# Patient Record
Sex: Female | Born: 1995 | Race: White | Hispanic: No | Marital: Single | Smoking: Never smoker
Health system: Southern US, Community
[De-identification: ages and names within clinical notes are randomized; demographics above are authoritative.]

## PROBLEM LIST (undated history)

## (undated) DIAGNOSIS — E109 Type 1 diabetes mellitus without complications: Secondary | ICD-10-CM

---
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
## 2021-10-01 ENCOUNTER — Emergency Department (HOSPITAL_COMMUNITY): Payer: Self-pay

## 2021-10-01 ENCOUNTER — Other Ambulatory Visit: Payer: Self-pay

## 2021-10-01 ENCOUNTER — Emergency Department (HOSPITAL_COMMUNITY)
Admission: EM | Admit: 2021-10-01 | Discharge: 2021-10-01 | Disposition: A | Discharge status: Home / Self Care | Payer: Self-pay | Attending: Emergency Medicine | Admitting: Emergency Medicine

## 2021-10-01 DIAGNOSIS — R509 Fever, unspecified: Secondary | ICD-10-CM | POA: Insufficient documentation

## 2021-10-01 DIAGNOSIS — J029 Acute pharyngitis, unspecified: Secondary | ICD-10-CM | POA: Insufficient documentation

## 2021-10-01 DIAGNOSIS — N9489 Other specified conditions associated with female genital organs and menstrual cycle: Secondary | ICD-10-CM | POA: Insufficient documentation

## 2021-10-01 DIAGNOSIS — R112 Nausea with vomiting, unspecified: Secondary | ICD-10-CM | POA: Insufficient documentation

## 2021-10-01 LAB — RESPIRATORY PANEL BY PCR

## 2021-10-01 LAB — CBC WITH DIFFERENTIAL/PLATELET
Abs Immature Granulocytes: 0.1 10*3/uL — ABNORMAL HIGH (ref 0.00–0.07)
Basophils Absolute: 0 10*3/uL (ref 0.0–0.1)
Basophils Relative: 0 %
Eosinophils Absolute: 0 10*3/uL (ref 0.0–0.5)
Eosinophils Relative: 0 %
HCT: 39.5 % (ref 36.0–46.0)
Hemoglobin: 13.5 g/dL (ref 12.0–15.0)
Immature Granulocytes: 1 %
Lymphocytes Relative: 4 %
Lymphs Abs: 0.6 10*3/uL — ABNORMAL LOW (ref 0.7–4.0)
MCH: 32.1 pg (ref 26.0–34.0)
MCHC: 34.2 g/dL (ref 30.0–36.0)
MCV: 93.8 fL (ref 80.0–100.0)
Monocytes Absolute: 1 10*3/uL (ref 0.1–1.0)
Monocytes Relative: 6 %
Neutro Abs: 14 10*3/uL — ABNORMAL HIGH (ref 1.7–7.7)
Neutrophils Relative %: 89 %
Platelets: 125 10*3/uL — ABNORMAL LOW (ref 150–400)
RBC: 4.21 MIL/uL (ref 3.87–5.11)
RDW: 11.9 % (ref 11.5–15.5)
WBC: 15.7 10*3/uL — ABNORMAL HIGH (ref 4.0–10.5)
nRBC: 0 % (ref 0.0–0.2)

## 2021-10-01 LAB — BASIC METABOLIC PANEL
Anion gap: 9 (ref 5–15)
BUN: 11 mg/dL (ref 6–20)
CO2: 21 mmol/L — ABNORMAL LOW (ref 22–32)
Calcium: 9.1 mg/dL (ref 8.9–10.3)
Chloride: 105 mmol/L (ref 98–111)
Creatinine, Ser: 0.74 mg/dL (ref 0.44–1.00)
GFR, Estimated: >60 mL/min (ref >60)
Glucose, Bld: 112 mg/dL — ABNORMAL HIGH (ref 70–99)
Potassium: 3.7 mmol/L (ref 3.5–5.1)
Sodium: 135 mmol/L (ref 135–145)

## 2021-10-01 LAB — I-STAT BETA HCG BLOOD, ED (MC, WL, AP ONLY): I-stat hCG, quantitative: <5 m[IU]/mL (ref <5)

## 2021-10-01 LAB — GROUP A STREP BY PCR: Group A Strep by PCR: NOT DETECTED

## 2021-10-01 MED ORDER — IOHEXOL 300 MG/ML  SOLN
75.0000 mL | Freq: Once | INTRAMUSCULAR | Status: AC | PRN
  Administered 2021-10-01: 75 mL via INTRAVENOUS

## 2021-10-01 MED ORDER — IOHEXOL 300 MG/ML  SOLN: 75.0000 mL | Freq: Once | INTRAMUSCULAR | Status: DC | PRN

## 2021-10-01 MED ORDER — ONDANSETRON HCL 4 MG/2ML IJ SOLN
4.0000 mg | Freq: Once | INTRAMUSCULAR | Status: AC
  Administered 2021-10-01: 4 mg via INTRAVENOUS
  Filled 2021-10-01: qty 2

## 2021-10-01 MED ORDER — SODIUM CHLORIDE 0.9 % IV BOLUS
1000.0000 mL | Freq: Once | INTRAVENOUS | Status: AC
  Administered 2021-10-01: 1000 mL via INTRAVENOUS

## 2021-10-01 MED ORDER — DEXAMETHASONE SODIUM PHOSPHATE 10 MG/ML IJ SOLN
10.0000 mg | Freq: Once | INTRAMUSCULAR | Status: AC
  Administered 2021-10-01: 10 mg via INTRAVENOUS
  Filled 2021-10-01: qty 1

## 2021-10-01 MED ORDER — PROMETHAZINE HCL 25 MG PO TABS: 25.0000 mg | ORAL_TABLET | Freq: Four times a day (QID) | ORAL | 0 refills | Status: AC | PRN

## 2021-10-01 MED ORDER — ONDANSETRON 4 MG PO TBDP: 4.0000 mg | ORAL_TABLET | Freq: Once | ORAL | Status: DC

## 2021-10-01 MED ORDER — KETOROLAC TROMETHAMINE 30 MG/ML IJ SOLN: 30.0000 mg | Freq: Once | INTRAMUSCULAR | 0 refills | Status: AC

## 2021-10-01 NOTE — Discharge Instructions (Signed)
Your COVID, flu, and strep swabs were all negative today.  Additionally, your CT scan of your neck was unremarkable for abscess.  Feel you likely have a different upper respiratory infection.  As these are almost always viral in nature, antibiotics are not recommended.  You were given steroids in the emergency department this evening, these should continue to work and decreased swelling in your throat.  Additionally, you may use Cepacol throat lozenges for pain relief as well as throat spray.  Both of these you can buy over-the-counter at your local pharmacy.  Also, please use Tylenol and ibuprofen as needed for fever management.  Additionally, I have given you a nausea medication.  Please take this as prescribed as needed for nausea and vomiting.  Return if you are unable to tolerate your own oral secretions or if you have vomiting which cannot be managed with nausea medication.  As always, you may return to the emergency department for any new or worsening symptoms.

## 2021-10-01 NOTE — ED Notes (Signed)
Pt transported to CT ?

## 2021-10-01 NOTE — ED Provider Notes (Signed)
Patient care assumed from [facility], PA-C at shift change.  Please see their note for further.  Briefly: Patient with sore throat that started today, worse on the left side with associated nausea and vomiting.  Left tonsil more swollen than right, she endorses significant difficulty swallowing although observed to be tolerating her secretions.  COVID, flu, strep negative.  Ddx: PTA, RPA, URI  Plan: CT neck and IV fluids, reevaluation based on results of scan, potential fluid challenge and dispo with antiemetics.  Patient's CT scan unremarkable for acute findings.  Discussed results with patient, gave Sprite which she was able to drink without emesis or difficulty.  Upon reevaluation and fluid challenge, patient states that she is feeling much better and would like to go home.  Suspect that patient has an upper respiratory infection, plan to discharge with antiemetics and close follow-up with supportive care management.  Discussed this with patient who is amenable with plan of discharge, educated on red flag symptoms that would prompt immediate return.  She is afebrile, nontoxic-appearing, in no acute distress and it has been sometime since her last episode of emesis.  Discharged in stable condition.   Vear Clock 10/01/21 Eyvonne Left, MD 10/02/21 [phone]

## 2021-10-01 NOTE — ED Triage Notes (Signed)
BIB POV, after pt reports increase n/v as of this AM. Per pt, pt unable to hold any PO intake down, has been having chills and throat is sore.

## 2021-10-01 NOTE — ED Notes (Signed)
Pt returned from CT °

## 2021-10-01 NOTE — ED Provider Notes (Signed)
[facility] EMERGENCY DEPARTMENT Provider Note   CSN: [id number] Arrival date & time: 10/01/21  1055    History Flu symptoms   Karen Oneill is a 25 y.o. female with no significant past medical history who presents for evaluation of sore throat, fever and emesis.  Began 2 days ago.  History of similar requiring hospitalization.  Denies chance of pregnancy.  States  sore throat worse on the left.  Multiple episodes of NBNB emesis, unable to tolerate liquids.  Has had some chills without documented fever.  No cough, congestion, rhinorrhea, shortness of breath, abdominal pain, diarrhea, dysuria.  Feels like her bilateral upper extremities are contracting due to dehydration.  Denies additional aggravating or alleviating factors.  History obtained from patient and past medical records.  No interpreter is used.  HPI     No past medical history on file.  There are no problems to display for this patient.   No past surgical history on file.   OB History   No obstetric history on file.     No family history on file.  Social History   Tobacco Use   Smoking status: Never  Substance Use Topics   Alcohol use: No   Drug use: No    Home Medications Prior to Admission medications   Medication Sig Start Date End Date Taking? Authorizing Provider  ketorolac (TORADOL) 30 MG/ML injection Inject 1 mL (30 mg total) into the vein once for 1 dose. 10/01/21 10/01/21 Yes Tanis Burnley A, PA-C    Allergies    Sulfa antibiotics  Review of Systems   Review of Systems  Constitutional:  Positive for chills and fever (subjective).  HENT:  Positive for sore throat and trouble swallowing. Negative for congestion, postnasal drip, rhinorrhea, sinus pressure, sneezing and voice change.   Eyes: Negative.   Respiratory: Negative.    Cardiovascular: Negative.   Gastrointestinal:  Positive for nausea and vomiting. Negative for abdominal distention, abdominal pain, anal  bleeding, blood in stool, diarrhea and rectal pain.  Genitourinary: Negative.   Musculoskeletal: Negative.   Skin: Negative.   Neurological: Negative.   All other systems reviewed and are negative.  Physical Exam Updated Vital Signs BP 124/82 (BP Location: Right Arm)   Pulse 97   Temp 98.6 F (37 C)   Resp 20   SpO2 100%   Physical Exam Vitals and nursing note reviewed.  Constitutional:      General: She is not in acute distress.    Appearance: She is well-developed. She is not ill-appearing, toxic-appearing or diaphoretic.  HENT:     Head: Normocephalic and atraumatic.     Jaw: There is normal jaw occlusion.     Comments: No drooling or dysphagia or trismus.    Nose: Nose normal. No congestion or rhinorrhea.     Mouth/Throat:     Lips: Pink.     Mouth: Mucous membranes are moist.     Tongue: No lesions. Tongue does not deviate from midline.     Pharynx: Oropharynx is clear. Posterior oropharyngeal erythema present. No oropharyngeal exudate or uvula swelling.     Tonsils: No tonsillar exudate. 1+ on the right. 2+ on the left.     Comments: Posterior oropharynx erythematous, tonsillar edema left greater than right, 2+ left, 1+ right.  Uvula midline without edema.  Sublingual area soft.  No pooling of secretions Eyes:     Pupils: Pupils are equal, round, and reactive to light.  Neck:     Trachea:  Trachea and phonation normal.     Comments: Full range of motion neck.  Normal phonation Cardiovascular:     Rate and Rhythm: Normal rate.  Pulmonary:     Effort: Pulmonary effort is normal. No respiratory distress.     Breath sounds: Normal breath sounds and air entry.     Comments: Clear Bilaterally, speaks in full sentences Abdominal:     General: Bowel sounds are normal. There is no distension.     Palpations: Abdomen is soft.     Tenderness: There is no abdominal tenderness.     Comments: Soft, nontender  Musculoskeletal:        General: Normal range of motion.      Cervical back: Full passive range of motion without pain and normal range of motion.  Skin:    General: Skin is warm and dry.  Neurological:     General: No focal deficit present.     Mental Status: She is alert.  Psychiatric:        Mood and Affect: Mood normal.    ED Results / Procedures / Treatments   Labs (all labs ordered are listed, but only abnormal results are displayed) Labs Reviewed  CBC WITH DIFFERENTIAL/PLATELET - Abnormal; Notable for the following components:      Result Value   WBC 15.7 (*)    Platelets 125 (*)    Neutro Abs 14.0 (*)    Lymphs Abs 0.6 (*)    Abs Immature Granulocytes 0.10 (*)    All other components within normal limits  BASIC METABOLIC PANEL - Abnormal; Notable for the following components:   CO2 21 (*)    Glucose, Bld 112 (*)    All other components within normal limits  RESPIRATORY PANEL BY PCR  GROUP A STREP BY PCR  I-STAT BETA HCG BLOOD, ED ([facility], [facility], [facility] ONLY)    EKG None  Radiology No results found.  Procedures Procedures   Medications Ordered in ED Medications  sodium chloride 0.9 % bolus 1,000 mL (1,000 mLs Intravenous New Bag/Given 10/01/21 1421)  ondansetron (ZOFRAN) injection 4 mg (4 mg Intravenous Given 10/01/21 1419)  dexamethasone (DECADRON) injection 10 mg (10 mg Intravenous Given 10/01/21 1420)    ED Course  I have reviewed the triage vital signs and the nursing notes.  Pertinent labs & imaging results that were available during my care of the patient were reviewed by me and considered in my medical decision making (see chart for details).  Here for evaluation of sore throat and fever as well as difficulty tolerating p.o. intake at home.  On arrival she is afebrile, nonseptic, not ill-appearing.  Multiple episodes of NBNB emesis at home.  She has asymmetrical tonsillar edema however no pooling of secretions, no phonation changes.  Uvula without any edema.  No obvious angioedema.  Heart and lungs clear.  Abdomen soft,  nontender.  We will plan on labs, imaging  Work-up obtained from triage which I personally reviewed and interpreted:  Strep test negative RPV negative CBC leukocytosis at 15.7 Metabolic panel glucose 112 Preg test negative  Care transferred to oncoming provider who will follow up on imaging, p.o. challenge and determine disposition  Clinical Course as of 10/01/21 1524  Wed Oct 01, 2021  1511 Sore throat worse on left throwing up, Zofran, CT neck for PTA, tolerating secretions [SS]    Clinical Course User Index [SS] Smoot, Shawn Route, PA-C   MDM Rules/Calculators/A&P  Final Clinical Impression(s) / ED Diagnoses Final diagnoses:  Nausea and vomiting, unspecified vomiting type  Sore throat    Rx / DC Orders ED Discharge Orders          Ordered    ketorolac (TORADOL) 30 MG/ML injection   Once        10/01/21 1457             Kynzli Rease A, PA-C 10/01/21 1524    Virgina Norfolk, DO 10/03/21 1343

## 2021-10-05 ENCOUNTER — Emergency Department (HOSPITAL_COMMUNITY)
Admission: EM | Admit: 2021-10-05 | Discharge: 2021-10-05 | Disposition: A | Discharge status: Home / Self Care | Payer: Self-pay | Attending: Emergency Medicine | Admitting: Emergency Medicine

## 2021-10-05 ENCOUNTER — Other Ambulatory Visit: Payer: Self-pay

## 2021-10-05 ENCOUNTER — Encounter (HOSPITAL_COMMUNITY): Payer: Self-pay

## 2021-10-05 DIAGNOSIS — Z20822 Contact with and (suspected) exposure to covid-19: Secondary | ICD-10-CM | POA: Insufficient documentation

## 2021-10-05 DIAGNOSIS — J039 Acute tonsillitis, unspecified: Secondary | ICD-10-CM

## 2021-10-05 DIAGNOSIS — J029 Acute pharyngitis, unspecified: Secondary | ICD-10-CM

## 2021-10-05 DIAGNOSIS — E109 Type 1 diabetes mellitus without complications: Secondary | ICD-10-CM | POA: Insufficient documentation

## 2021-10-05 HISTORY — DX: Type 1 diabetes mellitus without complications: E10.9

## 2021-10-05 LAB — CBC WITH DIFFERENTIAL/PLATELET
Abs Immature Granulocytes: 0.05 10*3/uL (ref 0.00–0.07)
Basophils Absolute: 0 10*3/uL (ref 0.0–0.1)
Basophils Relative: 0 %
Eosinophils Absolute: 0.1 10*3/uL (ref 0.0–0.5)
Eosinophils Relative: 1 %
HCT: 37.3 % (ref 36.0–46.0)
Hemoglobin: 12.7 g/dL (ref 12.0–15.0)
Immature Granulocytes: 0 %
Lymphocytes Relative: 8 %
Lymphs Abs: 1 10*3/uL (ref 0.7–4.0)
MCH: 32.1 pg (ref 26.0–34.0)
MCHC: 34 g/dL (ref 30.0–36.0)
MCV: 94.2 fL (ref 80.0–100.0)
Monocytes Absolute: 1.3 10*3/uL — ABNORMAL HIGH (ref 0.1–1.0)
Monocytes Relative: 10 %
Neutro Abs: 11 10*3/uL — ABNORMAL HIGH (ref 1.7–7.7)
Neutrophils Relative %: 81 %
Platelets: 141 10*3/uL — ABNORMAL LOW (ref 150–400)
RBC: 3.96 MIL/uL (ref 3.87–5.11)
RDW: 12.2 % (ref 11.5–15.5)
WBC: 13.5 10*3/uL — ABNORMAL HIGH (ref 4.0–10.5)
nRBC: 0 % (ref 0.0–0.2)

## 2021-10-05 LAB — COMPREHENSIVE METABOLIC PANEL
ALT: 13 U/L (ref 0–44)
AST: 13 U/L — ABNORMAL LOW (ref 15–41)
Albumin: 4 g/dL (ref 3.5–5.0)
Alkaline Phosphatase: 78 U/L (ref 38–126)
Anion gap: 12 (ref 5–15)
BUN: 12 mg/dL (ref 6–20)
CO2: 26 mmol/L (ref 22–32)
Calcium: 9.2 mg/dL (ref 8.9–10.3)
Chloride: 99 mmol/L (ref 98–111)
Creatinine, Ser: 0.48 mg/dL (ref 0.44–1.00)
GFR, Estimated: >60 mL/min (ref >60)
Glucose, Bld: 100 mg/dL — ABNORMAL HIGH (ref 70–99)
Potassium: 3.4 mmol/L — ABNORMAL LOW (ref 3.5–5.1)
Sodium: 137 mmol/L (ref 135–145)
Total Bilirubin: 1 mg/dL (ref 0.3–1.2)
Total Protein: 8 g/dL (ref 6.5–8.1)

## 2021-10-05 LAB — RESP PANEL BY RT-PCR (FLU A&B, COVID) ARPGX2
Influenza A by PCR: NEGATIVE
Influenza B by PCR: NEGATIVE
SARS Coronavirus 2 by RT PCR: NEGATIVE

## 2021-10-05 LAB — GROUP A STREP BY PCR: Group A Strep by PCR: NOT DETECTED

## 2021-10-05 LAB — MONONUCLEOSIS SCREEN: Mono Screen: NEGATIVE

## 2021-10-05 LAB — LIPASE, BLOOD: Lipase: 24 U/L (ref 11–51)

## 2021-10-05 MED ORDER — ONDANSETRON HCL 4 MG/2ML IJ SOLN
INTRAMUSCULAR | Status: AC
  Administered 2021-10-05: 14:00:00 4 mg via INTRAVENOUS
  Filled 2021-10-05: qty 2

## 2021-10-05 MED ORDER — CLINDAMYCIN HCL 300 MG PO CAPS: 300.0000 mg | ORAL_CAPSULE | Freq: Three times a day (TID) | ORAL | 0 refills | Status: DC

## 2021-10-05 MED ORDER — CLINDAMYCIN HCL 300 MG PO CAPS: 300.0000 mg | ORAL_CAPSULE | Freq: Three times a day (TID) | ORAL | 0 refills | Status: AC

## 2021-10-05 MED ORDER — PREDNISONE 50 MG PO TABS: 50.0000 mg | ORAL_TABLET | Freq: Every day | ORAL | 0 refills | Status: AC

## 2021-10-05 MED ORDER — PREDNISONE 50 MG PO TABS: 50.0000 mg | ORAL_TABLET | Freq: Every day | ORAL | 0 refills | Status: DC

## 2021-10-05 MED ORDER — ONDANSETRON HCL 4 MG PO TABS: 4.0000 mg | ORAL_TABLET | Freq: Four times a day (QID) | ORAL | 0 refills | Status: AC

## 2021-10-05 MED ORDER — MORPHINE SULFATE (PF) 4 MG/ML IV SOLN
4.0000 mg | Freq: Once | INTRAVENOUS | Status: AC
Start: 2021-10-05 — End: 2021-10-05
  Administered 2021-10-05: 10:00:00 4 mg via INTRAVENOUS
  Filled 2021-10-05: qty 1

## 2021-10-05 MED ORDER — LACTATED RINGERS IV BOLUS
1000.0000 mL | Freq: Once | INTRAVENOUS | Status: AC
  Administered 2021-10-05: 10:00:00 1000 mL via INTRAVENOUS

## 2021-10-05 MED ORDER — METHYLPREDNISOLONE SODIUM SUCC 125 MG IJ SOLR
125.0000 mg | Freq: Once | INTRAMUSCULAR | Status: AC
  Administered 2021-10-05: 10:00:00 125 mg via INTRAVENOUS
  Filled 2021-10-05: qty 2

## 2021-10-05 MED ORDER — ONDANSETRON HCL 4 MG/2ML IJ SOLN: 4.0000 mg | Freq: Once | INTRAMUSCULAR | Status: AC

## 2021-10-05 MED ORDER — ONDANSETRON HCL 4 MG/2ML IJ SOLN
4.0000 mg | Freq: Once | INTRAMUSCULAR | Status: AC
  Administered 2021-10-05: 12:00:00 4 mg via INTRAVENOUS
  Filled 2021-10-05: qty 2

## 2021-10-05 NOTE — ED Triage Notes (Signed)
Patient has had a sore throat that has getting worse over the past 5 days, now she said she cannot talk. Tonsils inflamed. She was seen at [facility] and her strep swab, covid and flu were all negative. Patient is able to swallow.

## 2021-10-05 NOTE — Discharge Instructions (Addendum)
You were diagnosed with a likely bacterial infection of your tonsils. I have given you some steroids while you are here to help with your swelling, but I also have sent in a prescription of steroids for you to take for the next 5 days - please take your first dose tomorrow since we gave you the injection already for today. Additionally, I have written a prescription for an antibiotic that you will take 3 times a day for 10 days. Please return if your swelling worsens and you have trouble breathing. I hope you feel better soon!! Alcario Drought

## 2021-10-05 NOTE — ED Provider Notes (Signed)
[facility]-EMERGENCY DEPT Provider Note   CSN: [id number] Arrival date & time: 10/05/21  0456     History Chief Complaint  Patient presents with   Sore Throat    Karen Oneill is a 25 y.o. female who presents for evaluation of sore throat with tonsillar swelling that started about 5 days ago and has been worsening since onset.  Patient was seen at [facility] 4 days ago with negative COVID, flu and strep tests.  CT scan of the neck at that time unremarkable for acute findings.  She was given some fluids and some nausea medication with some symptomatic improvement she was discharged home with nausea medication, however she states that her symptoms have been worsening since she left.  On exam she is unable to physically speak, instead she is using her phone to type out her messages due to the swelling of her tonsils.  She has been having intermittent fevers of around 101F with chills and cold sweats.  She has been taking 2 mg ibuprofen every 4 hours without symptomatic relief.  She also endorses headaches, difficulty breathing and some mild right upper quadrant pain that she describes as "jabbing".  She continues to feel nauseous and has vomited 4 times over the last 24 hours.  She does report some bright red blood observed and more recent episodes of emesis.  She denies symptoms of congestion, runny nose, chest pain, diarrhea.  No urinary symptoms.  Sore Throat Associated symptoms include abdominal pain and headaches. Pertinent negatives include no shortness of breath.      Past Medical History:  Diagnosis Date   Diabetes mellitus type 1 (HCC)     There are no problems to display for this patient.   History reviewed. No pertinent surgical history.   OB History   No obstetric history on file.     History reviewed. No pertinent family history.  Social History   Tobacco Use   Smoking status: Never  Substance Use Topics   Alcohol use: No   Drug use: No     Home Medications Prior to Admission medications   Medication Sig Start Date End Date Taking? Authorizing Provider  promethazine (PHENERGAN) 25 MG tablet Take 1 tablet (25 mg total) by mouth every 6 (six) hours as needed for nausea or vomiting. 10/01/21   Smoot, Shawn Route, PA-C    Allergies    Sulfa antibiotics  Review of Systems   Review of Systems  Constitutional:  Positive for fever.  HENT:  Positive for sore throat and trouble swallowing. Negative for rhinorrhea.   Eyes: Negative.   Respiratory:  Negative for shortness of breath.   Cardiovascular: Negative.   Gastrointestinal:  Positive for abdominal pain and nausea. Negative for diarrhea and vomiting.  Endocrine: Negative.   Genitourinary: Negative.   Musculoskeletal: Negative.   Skin:  Negative for rash.  Neurological:  Positive for headaches.  All other systems reviewed and are negative.  Physical Exam Updated Vital Signs BP 132/80 (BP Location: Right Arm)   Pulse 98   Temp 99.3 F (37.4 C) (Oral)   Resp 15   Ht 5\' 6"  (1.676 m)   Wt 65.8 kg   SpO2 95%   BMI 23.40 kg/m   Physical Exam Vitals and nursing note reviewed.  Constitutional:      General: She is not in acute distress.    Appearance: She is not ill-appearing.  HENT:     Head: Atraumatic.     Mouth/Throat:  Tonsils: 2+ on the right. 2+ on the left.  Eyes:     Conjunctiva/sclera: Conjunctivae normal.  Cardiovascular:     Rate and Rhythm: Normal rate and regular rhythm.     Pulses: Normal pulses.     Heart sounds: No murmur heard. Pulmonary:     Effort: Pulmonary effort is normal. No respiratory distress.     Breath sounds: Normal breath sounds.  Abdominal:     General: Abdomen is flat. There is no distension.     Palpations: Abdomen is soft.     Tenderness: There is no abdominal tenderness.  Musculoskeletal:        General: Normal range of motion.     Cervical back: Normal range of motion.  Skin:    General: Skin is warm and dry.      Capillary Refill: Capillary refill takes less than 2 seconds.  Neurological:     General: No focal deficit present.     Mental Status: She is alert.  Psychiatric:        Mood and Affect: Mood normal.    ED Results / Procedures / Treatments   Labs (all labs ordered are listed, but only abnormal results are displayed) Labs Reviewed  CBC WITH DIFFERENTIAL/PLATELET - Abnormal; Notable for the following components:      Result Value   WBC 13.5 (*)    Platelets 141 (*)    Neutro Abs 11.0 (*)    Monocytes Absolute 1.3 (*)    All other components within normal limits  RESP PANEL BY RT-PCR (FLU A&B, COVID) ARPGX2  GROUP A STREP BY PCR  MONONUCLEOSIS SCREEN  COMPREHENSIVE METABOLIC PANEL  LIPASE, BLOOD    EKG None  Radiology No results found.  Procedures Procedures   Medications Ordered in ED Medications  methylPREDNISolone sodium succinate (SOLU-MEDROL) 125 mg/2 mL injection 125 mg (125 mg Intravenous Given 10/05/21 1028)  lactated ringers bolus 1,000 mL (1,000 mLs Intravenous New Bag/Given 10/05/21 1027)  morphine 4 MG/ML injection 4 mg (4 mg Intravenous Given 10/05/21 1029)    ED Course  I have reviewed the triage vital signs and the nursing notes.  Pertinent labs & imaging results that were available during my care of the patient were reviewed by me and considered in my medical decision making (see chart for details).    MDM Rules/Calculators/A&P                         This is a 25 year old female who presents for evaluation of bilateral tonsillar swelling, sore throat.  She was seen at [facility] 4 days ago for similar symptoms.  At that time she had negative strep, negative COVID flu.  CT scan of her throat showed no acute infection and she was discharged home viral URI.  Since then patient's sore throat has continued to swell, and she is unable to speak today due to swelling is instead of typing on her phone to communicate.  On exam, there is significant bilateral  tonsillar swelling, erythema and exudate.  Palpable anterior cervical lymph nodes.  There is no trismus or drooling.  She is oxygenating in your 100% on room air.  Vitals are otherwise normal, she is afebrile.    COVID, flu and strep test were negative again today.  I also had a monotest performed as well which was negative.  CMP and CBC were relatively unremarkable except for elevated white blood cell count of about 14.  In the ED,  I gave patient morphine for pain, Zofran for nausea and 125 mg Solu-Medrol with a 1 L lactated ringer bolus.  Patient had some improvement with her symptoms, stating that she feels better than when she first walked in.  All her test were negative, given the worsening swelling, pain and neutrophilia suspect that patient has a an unspecified bacterial infection.  I will discharge her home with a course of clindamycin as well as a short course of prednisone to assist with her swelling and pain.  I discussed this all with patient, who understands, agrees and is amenable to plan.      Low suspicion for peritonsillar abscess given the bilateral nature and swelling.  Uvula midline.  Low suspicion for sepsis, patient is nontoxic-appearing, afebrile nontachycardic.  Low concern for mono given negative Monospot test.  All labs were independently reviewed and interpreted by myself, Raynald Blend, PA-C  Final Clinical Impression(s) / ED Diagnoses Final diagnoses:  Acute pharyngitis, unspecified etiology  Tonsillitis    Rx / DC Orders ED Discharge Orders          Ordered    predniSONE (DELTASONE) 50 MG tablet  Daily,   Status:  Discontinued        10/05/21 1319    predniSONE (DELTASONE) 50 MG tablet  Daily        10/05/21 1319    clindamycin (CLEOCIN) 300 MG capsule  3 times daily,   Status:  Discontinued        10/05/21 1313    clindamycin (CLEOCIN) 300 MG capsule  3 times daily        10/05/21 1319             Janell Quiet, [place] 10/05/21 1329    Linwood Dibbles, MD 10/06/21 [phone]

## 2022-02-09 ENCOUNTER — Emergency Department (HOSPITAL_COMMUNITY)
Admission: EM | Admit: 2022-02-09 | Discharge: 2022-02-09 | Disposition: A | Discharge status: Home / Self Care | Payer: Self-pay | Attending: Emergency Medicine | Admitting: Emergency Medicine

## 2022-02-09 ENCOUNTER — Encounter (HOSPITAL_COMMUNITY): Payer: Self-pay

## 2022-02-09 ENCOUNTER — Other Ambulatory Visit: Payer: Self-pay

## 2022-02-09 ENCOUNTER — Emergency Department (HOSPITAL_COMMUNITY): Payer: Self-pay

## 2022-02-09 DIAGNOSIS — D696 Thrombocytopenia, unspecified: Secondary | ICD-10-CM | POA: Insufficient documentation

## 2022-02-09 DIAGNOSIS — E876 Hypokalemia: Secondary | ICD-10-CM | POA: Insufficient documentation

## 2022-02-09 DIAGNOSIS — R197 Diarrhea, unspecified: Secondary | ICD-10-CM | POA: Insufficient documentation

## 2022-02-09 DIAGNOSIS — R10816 Epigastric abdominal tenderness: Secondary | ICD-10-CM | POA: Insufficient documentation

## 2022-02-09 DIAGNOSIS — R202 Paresthesia of skin: Secondary | ICD-10-CM | POA: Insufficient documentation

## 2022-02-09 DIAGNOSIS — R072 Precordial pain: Secondary | ICD-10-CM | POA: Insufficient documentation

## 2022-02-09 DIAGNOSIS — R112 Nausea with vomiting, unspecified: Secondary | ICD-10-CM | POA: Insufficient documentation

## 2022-02-09 LAB — COMPREHENSIVE METABOLIC PANEL
ALT: 18 U/L (ref 0–44)
AST: 19 U/L (ref 15–41)
Albumin: 3.6 g/dL (ref 3.5–5.0)
Alkaline Phosphatase: 51 U/L (ref 38–126)
Anion gap: 8 (ref 5–15)
BUN: 8 mg/dL (ref 6–20)
CO2: 19 mmol/L — ABNORMAL LOW (ref 22–32)
Calcium: 8.1 mg/dL — ABNORMAL LOW (ref 8.9–10.3)
Chloride: 110 mmol/L (ref 98–111)
Creatinine, Ser: 0.71 mg/dL (ref 0.44–1.00)
GFR, Estimated: >60 mL/min (ref >60)
Glucose, Bld: 87 mg/dL (ref 70–99)
Potassium: 3.1 mmol/L — ABNORMAL LOW (ref 3.5–5.1)
Sodium: 137 mmol/L (ref 135–145)
Total Bilirubin: 0.9 mg/dL (ref 0.3–1.2)
Total Protein: 6.3 g/dL — ABNORMAL LOW (ref 6.5–8.1)

## 2022-02-09 LAB — URINALYSIS, ROUTINE W REFLEX MICROSCOPIC
Bilirubin Urine: NEGATIVE
Glucose, UA: NEGATIVE mg/dL
Ketones, ur: 80 mg/dL — AB
Nitrite: NEGATIVE
Protein, ur: NEGATIVE mg/dL
Specific Gravity, Urine: 1.01 (ref 1.005–1.030)
pH: 7 (ref 5.0–8.0)

## 2022-02-09 LAB — CBC WITH DIFFERENTIAL/PLATELET
Abs Immature Granulocytes: 0.02 10*3/uL (ref 0.00–0.07)
Basophils Absolute: 0 10*3/uL (ref 0.0–0.1)
Basophils Relative: 0 %
Eosinophils Absolute: 0 10*3/uL (ref 0.0–0.5)
Eosinophils Relative: 0 %
HCT: 36.8 % (ref 36.0–46.0)
Hemoglobin: 12.9 g/dL (ref 12.0–15.0)
Immature Granulocytes: 0 %
Lymphocytes Relative: 8 %
Lymphs Abs: 0.5 10*3/uL — ABNORMAL LOW (ref 0.7–4.0)
MCH: 31.5 pg (ref 26.0–34.0)
MCHC: 35.1 g/dL (ref 30.0–36.0)
MCV: 90 fL (ref 80.0–100.0)
Monocytes Absolute: 0.7 10*3/uL (ref 0.1–1.0)
Monocytes Relative: 13 %
Neutro Abs: 4.4 10*3/uL (ref 1.7–7.7)
Neutrophils Relative %: 79 %
Platelets: 117 10*3/uL — ABNORMAL LOW (ref 150–400)
RBC: 4.09 MIL/uL (ref 3.87–5.11)
RDW: 12.1 % (ref 11.5–15.5)
WBC: 5.7 10*3/uL (ref 4.0–10.5)
nRBC: 0 % (ref 0.0–0.2)

## 2022-02-09 LAB — I-STAT BETA HCG BLOOD, ED (MC, WL, AP ONLY): I-stat hCG, quantitative: <5 m[IU]/mL (ref <5)

## 2022-02-09 LAB — LIPASE, BLOOD: Lipase: 30 U/L (ref 11–51)

## 2022-02-09 LAB — CBG MONITORING, ED: Glucose-Capillary: 77 mg/dL (ref 70–99)

## 2022-02-09 MED ORDER — POTASSIUM CHLORIDE CRYS ER 20 MEQ PO TBCR
40.0000 meq | EXTENDED_RELEASE_TABLET | Freq: Once | ORAL | Status: AC
  Administered 2022-02-09: 40 meq via ORAL
  Filled 2022-02-09: qty 2

## 2022-02-09 MED ORDER — ONDANSETRON 4 MG PO TBDP: 4.0000 mg | ORAL_TABLET | Freq: Three times a day (TID) | ORAL | 0 refills | Status: DC | PRN

## 2022-02-09 MED ORDER — SODIUM CHLORIDE 0.9 % IV BOLUS
1000.0000 mL | Freq: Once | INTRAVENOUS | Status: AC
  Administered 2022-02-09: 1000 mL via INTRAVENOUS

## 2022-02-09 NOTE — Discharge Instructions (Signed)
Please read and follow all provided instructions. ? ?Your diagnoses today include:  ?1. Nausea vomiting and diarrhea   ? ? ?TTests performed today include: ?Blood cell counts and platelets ?Kidney and liver function tests ?Pancreas function test (called lipase) ?Urine test to look for infection ?A blood or urine test for pregnancy (women only) ?Vital signs. See below for your results today.  ? ?Medications prescribed:  ?Zofran (ondansetron) - for nausea and vomiting ? ?Take any prescribed medications only as directed. ? ?Home care instructions:  ?Follow any educational materials contained in this packet.  ?Your abdominal pain, nausea, vomiting, and diarrhea may be caused by a viral gastroenteritis also called 'stomach flu'. You should rest for the next several days. Keep drinking plenty of fluids and use the medicine for nausea as directed.   ?Drink clear liquids for the next 24 hours and introduce solid foods slowly after 24 hours using the b.r.a.t. diet (Bananas, Rice, Applesauce, Toast, Yogurt).   ? ?Follow-up instructions: ?Please follow-up with your primary care provider in the next 2 days for further evaluation of your symptoms. If you are not feeling better in 48 hours you may have a condition that is more serious and you need re-evaluation.  ? ?Return instructions:  ?SEEK IMMEDIATE MEDICAL ATTENTION IF: ?If you have pain that does not go away or becomes severe  ?A temperature above 101F develops  ?Repeated vomiting occurs (multiple episodes)  ?If you have pain that becomes localized to portions of the abdomen. The right side could possibly be appendicitis. In an adult, the left lower portion of the abdomen could be colitis or diverticulitis.  ?Blood is being passed in stools or vomit (bright red or black tarry stools)  ?You develop chest pain, difficulty breathing, dizziness or fainting, or become confused, poorly responsive, or inconsolable (young children) ?If you have any other emergent concerns regarding  your health ? ?Additional Information: ?Abdominal (belly) pain can be caused by many things. Your caregiver performed an examination and possibly ordered blood/urine tests and imaging (CT scan, x-rays, ultrasound). Many cases can be observed and treated at home after initial evaluation in the emergency department. Even though you are being discharged home, abdominal pain can be unpredictable. Therefore, you need a repeated exam if your pain does not resolve, returns, or worsens. Most patients with abdominal pain don't have to be admitted to the hospital or have surgery, but serious problems like appendicitis and gallbladder attacks can start out as nonspecific pain. Many abdominal conditions cannot be diagnosed in one visit, so follow-up evaluations are very important. ? ?Your vital signs today were: ?BP 109/68   Pulse 97   Temp 98.6 ?F (37 ?C)   Resp 20   Ht 5\' 6"  (1.676 m)   Wt 61.2 kg   LMP 02/08/2022 (Exact Date)   SpO2 99%   BMI 21.79 kg/m?  ?If your blood pressure (bp) was elevated above 135/85 this visit, please have this repeated by your doctor within one month. ?-------------- ? ?

## 2022-02-09 NOTE — ED Triage Notes (Signed)
Pt from home via EMS c/o n/v/d, pain in L sternal boarder, bilateral foot numbness, tingling in lips and hands. Pt noted to be hyperventilating and coached to take slower breaths. After this, pt still states that she feels like she might pass out. Pt experienced hand cramping as well. ? ?Pt states that her last PO intake was nearly 48 hours ago, and she hasn't been able to keep anything down. In the past 24 hours, pt reports at least 10 episodes of emesis and 2 episodes of diarrhea. Pt appears anxious uncomfortable during triage. EMS administered 4mg  zofran, which gave some relief of nausea, and approx 225 mL NS. ?

## 2022-02-09 NOTE — ED Provider Notes (Signed)
?[facility] DEPT ?Provider Note ? ? ?CSN: [id number] ?Arrival date & time: 02/09/22  [id number] ? ?  ? ?History ? ?Chief Complaint  ?Patient presents with  ? Nausea  ? Vomiting  ? Diarrhea  ? ? ?Karen Oneill is a 26 y.o. female. ? ?Patient with no past surgical history presents to the emergency department for evaluation of nausea, vomiting, and diarrhea.  Patient also endorses chest pain and tingling in her face and feet.  Patient states that she has been feeling nauseous for about the past 48 hours.  Over the past 10 hours she has had persistent vomiting, approximately every hour.  Vomiting is nonbloody and nonbilious.  She has associated mid chest pain without shortness of breath.  It is underneath the sternum and does not radiate.  She also reports mild upper abdominal pain, worse with vomiting.  Patient has had diarrhea and has noted some blood mixed in with the stool.  Blood is red.  No urinary symptoms reported.  She has tried taking a BC powder, which she does not do routinely, for her symptoms but that this did not help.  No other treatments prior to arrival.  She was transported to the emergency department by ambulance and received Zofran and a fluid bolus in route.  The nausea medication helped. ? ? ?  ? ?Home Medications ?Prior to Admission medications   ?Medication Sig Start Date End Date Taking? Authorizing Provider  ?promethazine (PHENERGAN) 25 MG tablet Take 1 tablet (25 mg total) by mouth every 6 (six) hours as needed for nausea or vomiting. 10/01/21   Smoot, Leary Roca, PA-C  ?   ? ?Allergies    ?Sulfa antibiotics   ? ?Review of Systems   ?Review of Systems ? ?Physical Exam ?Updated Vital Signs ?Pulse 91   Resp (!) 28   Ht 5\' 6"  (1.676 m)   Wt 61.2 kg   LMP 02/08/2022   SpO2 100%   BMI 21.79 kg/m?  ?Physical Exam ?Vitals and nursing note reviewed.  ?Constitutional:   ?   Appearance: She is well-developed.  ?HENT:  ?   Head: Normocephalic and atraumatic. No raccoon eyes  or Battle's sign.  ?   Right Ear: Tympanic membrane, ear canal and external ear normal. No hemotympanum.  ?   Left Ear: Tympanic membrane, ear canal and external ear normal. No hemotympanum.  ?   Nose: Nose normal.  ?   Mouth/Throat:  ?   Pharynx: Uvula midline.  ?Eyes:  ?   General: Lids are normal.  ?   Extraocular Movements:  ?   Right eye: No nystagmus.  ?   Left eye: No nystagmus.  ?   Conjunctiva/sclera: Conjunctivae normal.  ?   Pupils: Pupils are equal, round, and reactive to light.  ?   Comments: No visible hyphema noted  ?Cardiovascular:  ?   Rate and Rhythm: Normal rate and regular rhythm.  ?Pulmonary:  ?   Effort: Pulmonary effort is normal.  ?   Breath sounds: Normal breath sounds.  ?Abdominal:  ?   Palpations: Abdomen is soft.  ?   Tenderness: There is abdominal tenderness (Mild, epigastric). There is no guarding or rebound.  ?Musculoskeletal:  ?   Cervical back: Normal range of motion and neck supple. No tenderness or bony tenderness.  ?   Thoracic back: No tenderness or bony tenderness.  ?   Lumbar back: No tenderness or bony tenderness.  ?Skin: ?   General: Skin is warm and  dry.  ?Neurological:  ?   Mental Status: She is alert and oriented to person, place, and time.  ?   GCS: GCS eye subscore is 4. GCS verbal subscore is 5. GCS motor subscore is 6.  ?   Cranial Nerves: No cranial nerve deficit.  ?   Sensory: No sensory deficit.  ?   Coordination: Coordination normal.  ?   Deep Tendon Reflexes: Reflexes are normal and symmetric.  ? ? ?ED Results / Procedures / Treatments   ?Labs ?(all labs ordered are listed, but only abnormal results are displayed) ?Labs Reviewed  ?CBC WITH DIFFERENTIAL/PLATELET - Abnormal; Notable for the following components:  ?    Result Value  ? Platelets 117 (*)   ? Lymphs Abs 0.5 (*)   ? All other components within normal limits  ?COMPREHENSIVE METABOLIC PANEL - Abnormal; Notable for the following components:  ? Potassium 3.1 (*)   ? CO2 19 (*)   ? Calcium 8.1 (*)   ? Total  Protein 6.3 (*)   ? All other components within normal limits  ?URINALYSIS, ROUTINE W REFLEX MICROSCOPIC - Abnormal; Notable for the following components:  ? Hgb urine dipstick SMALL (*)   ? Ketones, ur 80 (*)   ? Leukocytes,Ua SMALL (*)   ? Bacteria, UA RARE (*)   ? All other components within normal limits  ?LIPASE, BLOOD  ?I-STAT BETA HCG BLOOD, ED ([facility], [facility], [facility] ONLY)  ?CBG MONITORING, ED  ? ? ?ED ECG REPORT ? ? Date: 02/09/2022 ? Rate: 75 ? Rhythm: normal sinus rhythm ? QRS Axis: normal ? Intervals: normal ? ST/T Wave abnormalities: normal ? Conduction Disutrbances:none ? Narrative Interpretation:  ? Old EKG Reviewed: none available ? ?I have personally reviewed the EKG tracing and agree with the computerized printout as noted. ? ? ?Radiology ?No results found. ? ?Procedures ?Procedures  ? ? ?Medications Ordered in ED ?Medications  ?sodium chloride 0.9 % bolus 1,000 mL (has no administration in time range)  ? ? ?ED Course/ Medical Decision Making/ A&P ?  ? ?Patient seen and examined. History obtained directly from patient.  ? ?Labs/EKG: Ordered CBC, CMP, lipase, UA, EKG. ? ?Imaging: Ordered chest x-ray.  Considered CT of the abdomen pelvis, however patient does not have significantly focal tenderness.  Will defer decision until return of lab work and reassessment. ? ?Medications/Fluids: Ordered: IV fluid bolus.  ? ?Most recent vital signs reviewed and are as follows: ?Pulse 91   Resp (!) 28   Ht 5\' 6"  (1.676 m)   Wt 61.2 kg   LMP 02/08/2022   SpO2 100%   BMI 21.79 kg/m?  ? ?Initial impression: Nausea, vomiting, diarrhea.  Also chest pain but low concern for ACS and PE at this time.  This is likely related to her vomiting. ? ?9:06 AM Reassessment performed. Patient appears comfortable.  Nausea remains resolved.  On reexam, abdomen is soft and nontender. ? ?Labs personally reviewed and interpreted including: CBC with normal white blood cell count, chronic thrombocytopenia at 117; CMP with potassium of 3.1  repleted, mildly low bicarb at 19, otherwise unremarkable; lipase normal; UA with ketones but no signs of infection.  Pregnancy negative. ? ?Imaging personally visualized and interpreted including: Chest x-ray, agree negative. ? ?Reviewed pertinent lab work and imaging with patient at bedside. Questions answered.  ? ?Most current vital signs reviewed and are as follows: ?BP 109/68   Pulse 97   Temp 98.6 ?F (37 ?C)   Resp 20   Ht  5\' 6"  (1.676 m)   Wt 61.2 kg   LMP 02/08/2022 (Exact Date)   SpO2 99%   BMI 21.79 kg/m?  ? ?Plan: Discharge to home.  ? ?Prescriptions written for: Zofran ? ?Other home care instructions discussed: Clear liquids, brat diet ? ?ED return instructions discussed: The patient was urged to return to the Emergency Department immediately with worsening of current symptoms, worsening abdominal pain, persistent vomiting, blood noted in stools, fever, or any other concerns. The patient verbalized understanding.  ? ?Follow-up instructions discussed: Patient encouraged to follow-up with their PCP in 2 days if not improving for recheck.  ? ?                        ?Medical Decision Making ?Amount and/or Complexity of Data Reviewed ?Radiology: ordered. ? ?Risk ?Prescription drug management. ? ? ?For this patient's complaint of abdominal pain, the following conditions were considered on the differential diagnosis: gastritis/PUD, enteritis/duodenitis, appendicitis, cholelithiasis/cholecystitis, cholangitis, pancreatitis, ruptured viscus, colitis, diverticulitis, proctitis, cystitis, pyelonephritis, ureteral colic, aortic dissection, aortic aneurysm. In women, ectopic pregnancy, pelvic inflammatory disease, ovarian cysts, and tubo-ovarian abscess were also considered. Atypical chest etiologies were also considered including ACS, PE, and pneumonia.  ? ?The patient's vital signs, pertinent lab work and imaging were reviewed and interpreted as discussed in the ED course. Hospitalization was considered  for further testing, treatments, or serial exams/observation. However as patient is well-appearing, has a stable exam, and reassuring studies today, I do not feel that they warrant admission at this time. This plan was discusse

## 2022-09-14 IMAGING — CR DG CHEST 2V
2 series · 2 of 2 positions shown · non-contrast
Comparison: Neck CT 10/01/2021.

CLINICAL DATA: 25-year-old female with chest pain, nausea vomiting.

EXAM:
CHEST - 2 VIEW

[w chest pa]
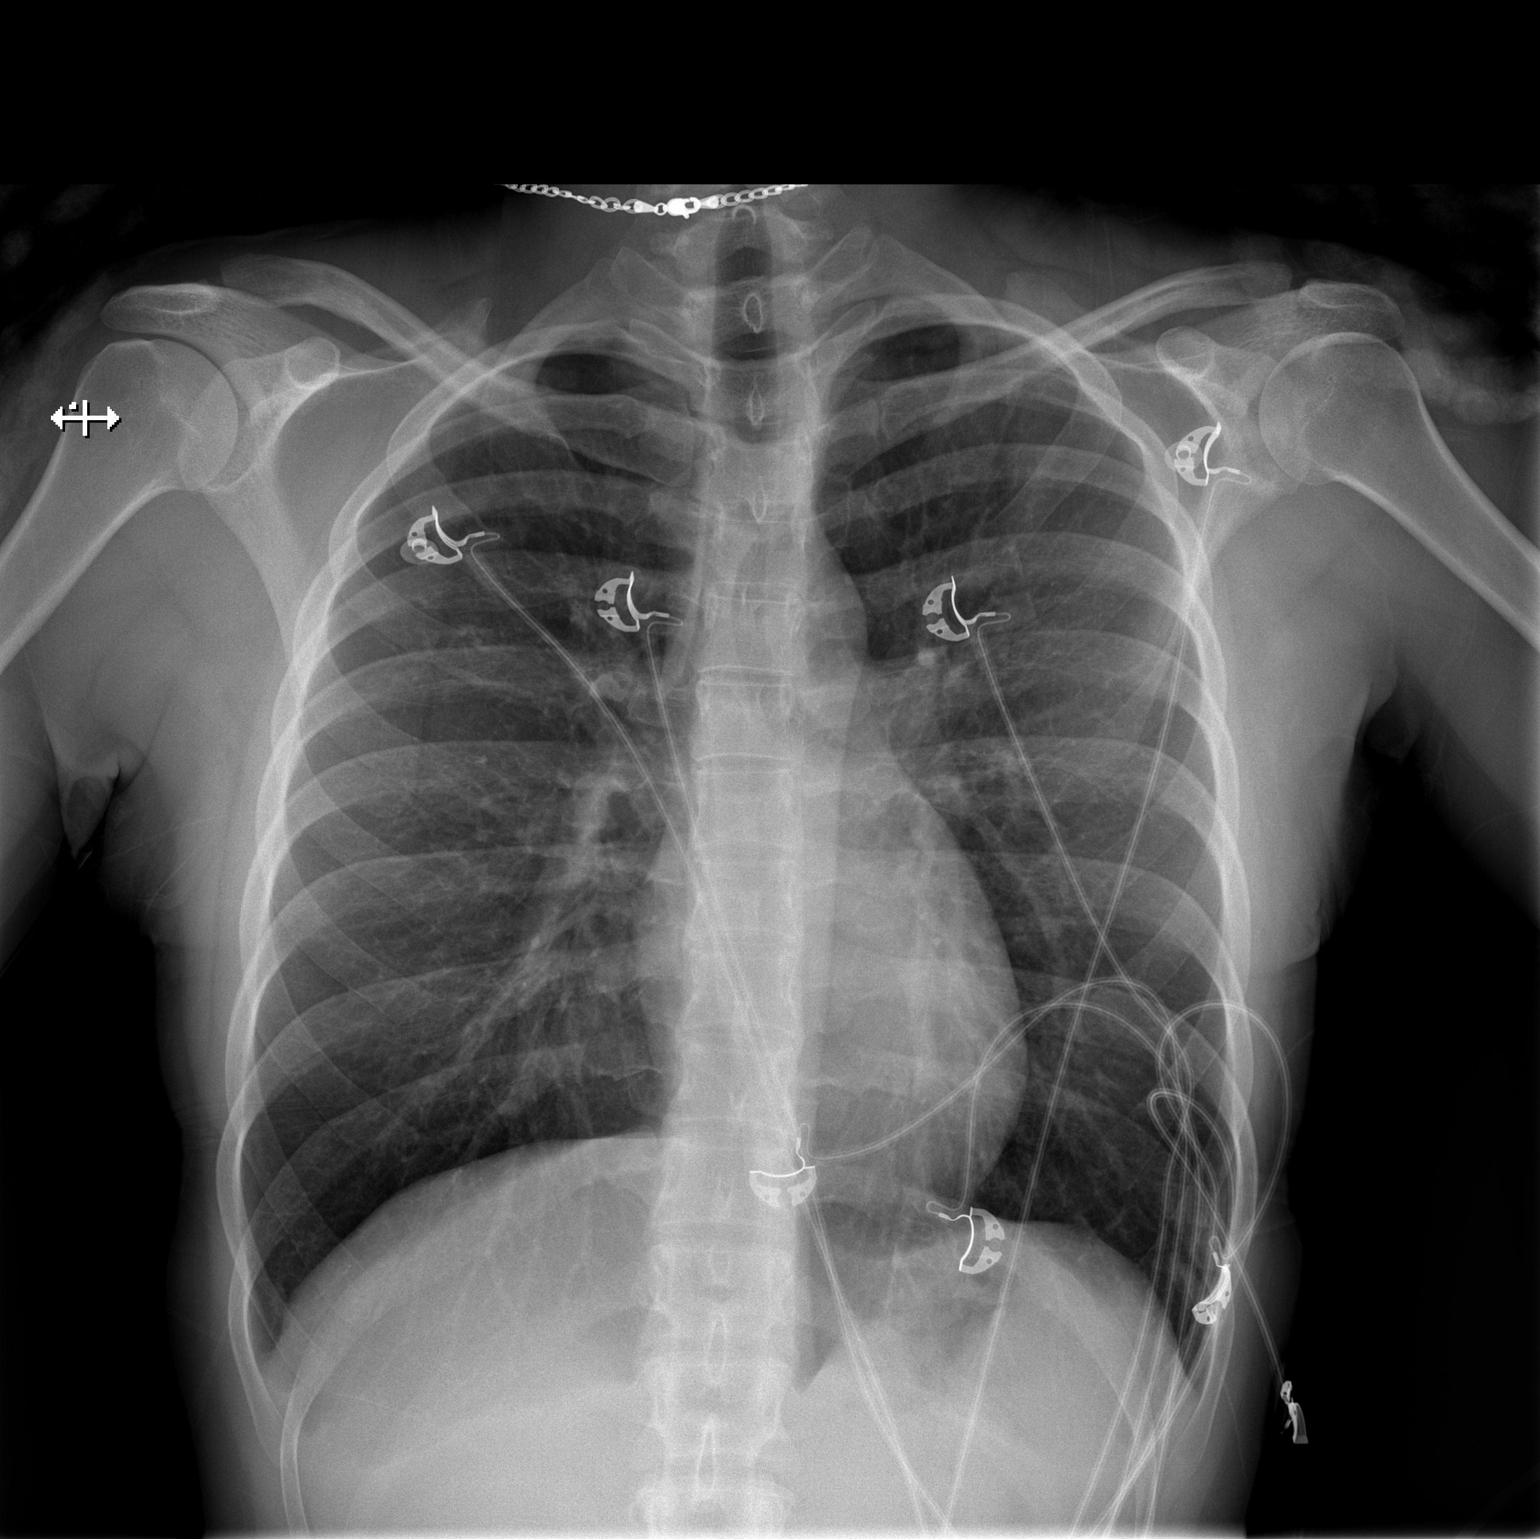

[w chest lat]
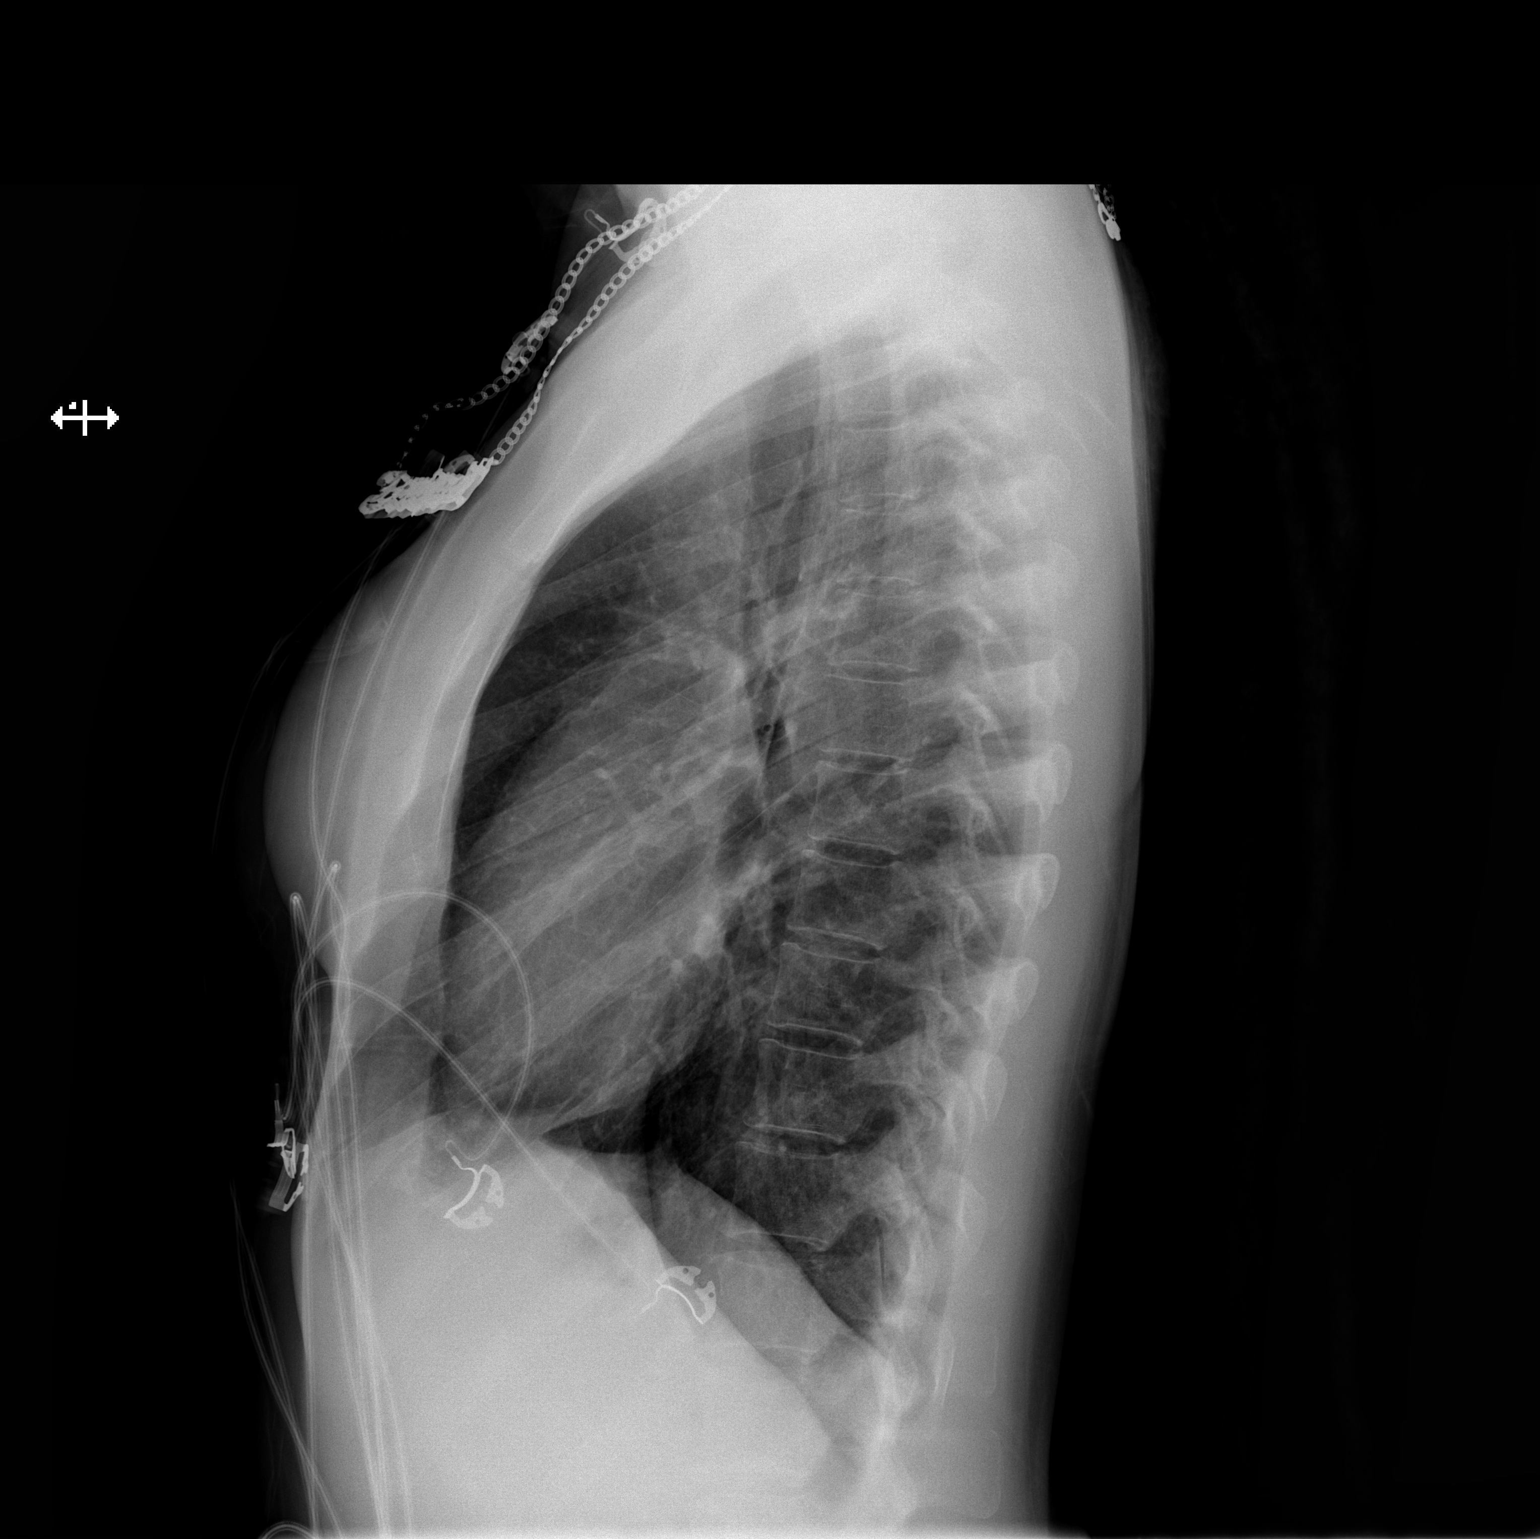

[2 of 2 positions shown; findings below may reference images not displayed]

FINDINGS: EKG leads and necklace artifact. Lung volumes and mediastinal
contours appear normal. Both lungs appear clear. No pneumothorax
identified. Visualized tracheal air column is within normal limits.
No osseous abnormality identified. Paucity of bowel gas in the upper
abdomen.
IMPRESSION: Negative.  No cardiopulmonary abnormality.

## 2022-11-07 ENCOUNTER — Other Ambulatory Visit: Payer: Self-pay

## 2022-11-07 ENCOUNTER — Emergency Department (HOSPITAL_COMMUNITY)
Admission: EM | Admit: 2022-11-07 | Discharge: 2022-11-07 | Disposition: A | Discharge status: Home / Self Care | Payer: Self-pay | Attending: Emergency Medicine | Admitting: Emergency Medicine

## 2022-11-07 DIAGNOSIS — Z1152 Encounter for screening for COVID-19: Secondary | ICD-10-CM | POA: Insufficient documentation

## 2022-11-07 DIAGNOSIS — E876 Hypokalemia: Secondary | ICD-10-CM | POA: Insufficient documentation

## 2022-11-07 DIAGNOSIS — J101 Influenza due to other identified influenza virus with other respiratory manifestations: Secondary | ICD-10-CM | POA: Insufficient documentation

## 2022-11-07 LAB — BASIC METABOLIC PANEL
Anion gap: 14 (ref 5–15)
BUN: 9 mg/dL (ref 6–20)
CO2: 19 mmol/L — ABNORMAL LOW (ref 22–32)
Calcium: 9.2 mg/dL (ref 8.9–10.3)
Chloride: 101 mmol/L (ref 98–111)
Creatinine, Ser: 0.75 mg/dL (ref 0.44–1.00)
GFR, Estimated: >60 mL/min (ref >60)
Glucose, Bld: 88 mg/dL (ref 70–99)
Potassium: 3.2 mmol/L — ABNORMAL LOW (ref 3.5–5.1)
Sodium: 134 mmol/L — ABNORMAL LOW (ref 135–145)

## 2022-11-07 LAB — RESP PANEL BY RT-PCR (RSV, FLU A&B, COVID)  RVPGX2
Influenza A by PCR: NEGATIVE
Influenza B by PCR: POSITIVE — AB
Resp Syncytial Virus by PCR: NEGATIVE
SARS Coronavirus 2 by RT PCR: NEGATIVE

## 2022-11-07 LAB — CBC
HCT: 39.1 % (ref 36.0–46.0)
Hemoglobin: 13.4 g/dL (ref 12.0–15.0)
MCH: 31.4 pg (ref 26.0–34.0)
MCHC: 34.3 g/dL (ref 30.0–36.0)
MCV: 91.6 fL (ref 80.0–100.0)
Platelets: 122 10*3/uL — ABNORMAL LOW (ref 150–400)
RBC: 4.27 MIL/uL (ref 3.87–5.11)
RDW: 12.2 % (ref 11.5–15.5)
WBC: 5.3 10*3/uL (ref 4.0–10.5)
nRBC: 0 % (ref 0.0–0.2)

## 2022-11-07 MED ORDER — ONDANSETRON HCL 4 MG PO TABS: 4.0000 mg | ORAL_TABLET | Freq: Four times a day (QID) | ORAL | 0 refills | Status: AC

## 2022-11-07 MED ORDER — ONDANSETRON 4 MG PO TBDP
4.0000 mg | ORAL_TABLET | Freq: Once | ORAL | Status: AC
  Administered 2022-11-07: 4 mg via ORAL
  Filled 2022-11-07: qty 1

## 2022-11-07 MED ORDER — OSELTAMIVIR PHOSPHATE 75 MG PO CAPS
75.0000 mg | ORAL_CAPSULE | Freq: Two times a day (BID) | ORAL | 0 refills | Status: AC
Start: 2022-11-07 — End: ?

## 2022-11-07 MED ORDER — SODIUM CHLORIDE 0.9 % IV BOLUS
1000.0000 mL | Freq: Once | INTRAVENOUS | Status: AC
  Administered 2022-11-07: 1000 mL via INTRAVENOUS

## 2022-11-07 MED ORDER — HALOPERIDOL LACTATE 5 MG/ML IJ SOLN
2.5000 mg | Freq: Once | INTRAMUSCULAR | Status: AC
  Administered 2022-11-07: 2.5 mg via INTRAMUSCULAR
  Filled 2022-11-07: qty 1

## 2022-11-07 MED ORDER — ACETAMINOPHEN 500 MG PO TABS
1000.0000 mg | ORAL_TABLET | Freq: Once | ORAL | Status: AC
Start: 2022-11-07 — End: 2022-11-07
  Administered 2022-11-07: 1000 mg via ORAL
  Filled 2022-11-07: qty 2

## 2022-11-07 NOTE — ED Triage Notes (Signed)
Pt arrives with reports of emesis and body aches starting today. Pt reports coming in contact with someone with the flu. Pt denies any medical history.

## 2022-11-07 NOTE — Discharge Instructions (Addendum)
It was a pleasure taking care of you today!   Your COVID and RSV swabs were negative today. Your flu swab was positive for Flu B. You will be sent a prescription for Tamiflu, take as directed. You will be sent a prescription for zofran, take as directed. You may continue with over the counter cough and cold medications. Ensure to maintain fluid intake. Decrease your intake of marijuana as it may be contributing to your symptoms. Follow up with your primary care provider regarding todays ED visit. Ensure that you are wearing your mask and practicing good hand hygiene. Return to the ED if you are experiencing increasing/worsening symptoms.

## 2022-11-07 NOTE — ED Provider Notes (Signed)
[facility]-EMERGENCY DEPT Provider Note   CSN: [id number] Arrival date & time: 11/07/22  0156     History  Chief Complaint  Patient presents with   Emesis   Generalized Body Aches    Karen Oneill is a 27 y.o. female who presents emergency department with concerns for generalized bodyaches as well as emesis starting today.  Patient has sick contacts at home with similar symptoms.  Has associated cough, rhinorrhea, nasal congestion.  No meds tried prior to arrival.  Denies trouble breathing, trouble swallowing, fever.  The history is provided by the patient. No language interpreter was used.       Home Medications Prior to Admission medications   Medication Sig Start Date End Date Taking? Authorizing Provider  ondansetron (ZOFRAN) 4 MG tablet Take 1 tablet (4 mg total) by mouth every 6 (six) hours. 11/07/22  Yes Deegan Valentino A, PA-C  oseltamivir (TAMIFLU) 75 MG capsule Take 1 capsule (75 mg total) by mouth every 12 (twelve) hours. 11/07/22  Yes Tyrell Seifer A, PA-C  ondansetron (ZOFRAN-ODT) 4 MG disintegrating tablet Take 1 tablet (4 mg total) by mouth every 8 (eight) hours as needed for nausea or vomiting. 02/09/22   Renne Crigler, PA-C  promethazine (PHENERGAN) 25 MG tablet Take 1 tablet (25 mg total) by mouth every 6 (six) hours as needed for nausea or vomiting. 10/01/21   Smoot, Shawn Route, PA-C      Allergies    Sulfa antibiotics    Review of Systems   Review of Systems  Gastrointestinal:  Positive for vomiting.  All other systems reviewed and are negative.   Physical Exam Updated Vital Signs BP 105/64   Pulse (!) 129   Temp 98.1 F (36.7 C) (Oral)   Resp 20   Ht 5\' 6"  (1.676 m)   Wt 56.7 kg   LMP 10/28/2022 (Approximate)   SpO2 100%   BMI 20.18 kg/m  Physical Exam Vitals and nursing note reviewed.  Constitutional:      General: She is not in acute distress.    Appearance: She is not diaphoretic.  HENT:     Head: Normocephalic and  atraumatic.     Mouth/Throat:     Pharynx: No oropharyngeal exudate.  Eyes:     General: No scleral icterus.    Conjunctiva/sclera: Conjunctivae normal.  Cardiovascular:     Rate and Rhythm: Normal rate and regular rhythm.     Pulses: Normal pulses.     Heart sounds: Normal heart sounds.  Pulmonary:     Effort: Pulmonary effort is normal. No respiratory distress.     Breath sounds: Normal breath sounds. No wheezing.  Abdominal:     General: Bowel sounds are normal.     Palpations: Abdomen is soft. There is no mass.     Tenderness: There is no abdominal tenderness. There is no guarding or rebound.  Musculoskeletal:        General: Normal range of motion.     Cervical back: Normal range of motion and neck supple.  Skin:    General: Skin is warm and dry.  Neurological:     Mental Status: She is alert.  Psychiatric:        Behavior: Behavior normal.     ED Results / Procedures / Treatments   Labs (all labs ordered are listed, but only abnormal results are displayed) Labs Reviewed  RESP PANEL BY RT-PCR (RSV, FLU A&B, COVID)  RVPGX2 - Abnormal; Notable for the following components:  Result Value   Influenza B by PCR POSITIVE (*)    All other components within normal limits  CBC - Abnormal; Notable for the following components:   Platelets 122 (*)    All other components within normal limits  BASIC METABOLIC PANEL - Abnormal; Notable for the following components:   Sodium 134 (*)    Potassium 3.2 (*)    CO2 19 (*)    All other components within normal limits    EKG None  Radiology No results found.  Procedures Procedures    Medications Ordered in ED Medications  acetaminophen (TYLENOL) tablet 1,000 mg (has no administration in time range)  ondansetron (ZOFRAN-ODT) disintegrating tablet 4 mg (4 mg Oral Given 11/07/22 0215)  sodium chloride 0.9 % bolus 1,000 mL (1,000 mLs Intravenous Bolus 11/07/22 0409)  haloperidol lactate (HALDOL) injection 2.5 mg (2.5 mg  Intramuscular Given 11/07/22 0558)    ED Course/ Medical Decision Making/ A&P Clinical Course as of 11/07/22 0655  Sat Nov 07, 2022  0340 Influenza B By PCR(!): POSITIVE [SB]  0449 Pt re-evaluated and noted that she has a history of similar symptoms in the past which she was hospitalized for it before.  [SB]  0634 Re-evaluated and noted improvement of symptoms with Haldol in the emergency department. [SB]    Clinical Course User Index [SB] Herman Fiero A, PA-C                           Medical Decision Making Amount and/or Complexity of Data Reviewed Labs: ordered. Decision-making details documented in ED Course.  Risk OTC drugs. Prescription drug management.   Pt presents with concerns for generalized bodyaches and emesis onset today.  Patient is a daily marijuana user.  Has a history of similar symptoms.  No sick contacts at home with similar symptoms.  Patient afebrile, tachycardic on exam.  No acute cardiovascular respiratory exam findings. Differential diagnosis includes COVID, flu, RSV, pneumonia, viral URI with cough.  Labs:  I ordered, and personally interpreted labs.  The pertinent results include:   Negative COVID and RSV swabs Positive Flu B swab BMP with low potassium at 3.2 otherwise unremarkable CBC without leukocytosis   Medications:  I ordered medication including IVF, zofran, haldol, tylenol for symptom management Reevaluation of the patient after these medicines and interventions, I reevaluated the patient and found that they have improved I have reviewed the patients home medicines and have made adjustments as needed   Disposition: Presentation suspicious for Flu B. Doubt COVID, RSV, Strep pharyngitis, or PNA at this time. Discussed with patient regarding use of Tamiflu and discussed thoroughly on its risks and benefits. Follow discussion, patient opted for supportive care at this time. Work/school note provided. Discussed with patient that they should not  attend work until they are fever free for 24 hours. After consideration of the diagnostic results and the patients response to treatment, I feel that the patient would benefit from Discharge home. Rx for Tamiflu and zofran sent. Supportive care measures and strict return precautions discussed with patient at bedside. Pt acknowledges and verbalizes understanding. Pt appears safe for discharge. Follow up as indicated in discharge paperwork.    This chart was dictated using voice recognition software, Dragon. Despite the best efforts of this provider to proofread and correct errors, errors may still occur which can change documentation meaning.   Final Clinical Impression(s) / ED Diagnoses Final diagnoses:  Influenza B    Rx / DC  Orders ED Discharge Orders          Ordered    oseltamivir (TAMIFLU) 75 MG capsule  Every 12 hours        11/07/22 0649    ondansetron (ZOFRAN) 4 MG tablet  Every 6 hours        11/07/22 0649              Amy Belloso A, PA-C 11/07/22 4627    Quintella Reichert, MD 11/07/22 1900

## 2024-10-25 ENCOUNTER — Emergency Department (HOSPITAL_COMMUNITY): Payer: Self-pay

## 2024-10-25 ENCOUNTER — Other Ambulatory Visit: Payer: Self-pay

## 2024-10-25 ENCOUNTER — Emergency Department (HOSPITAL_COMMUNITY)
Admission: EM | Admit: 2024-10-25 | Discharge: 2024-10-25 | Disposition: A | Discharge status: Home / Self Care | Payer: Self-pay | Attending: Emergency Medicine | Admitting: Emergency Medicine

## 2024-10-25 ENCOUNTER — Encounter (HOSPITAL_COMMUNITY): Payer: Self-pay | Admitting: Emergency Medicine

## 2024-10-25 DIAGNOSIS — J101 Influenza due to other identified influenza virus with other respiratory manifestations: Secondary | ICD-10-CM | POA: Insufficient documentation

## 2024-10-25 DIAGNOSIS — J111 Influenza due to unidentified influenza virus with other respiratory manifestations: Secondary | ICD-10-CM

## 2024-10-25 DIAGNOSIS — R000 Tachycardia, unspecified: Secondary | ICD-10-CM | POA: Insufficient documentation

## 2024-10-25 LAB — URINE DRUG SCREEN
Amphetamines: NEGATIVE
Barbiturates: NEGATIVE
Benzodiazepines: NEGATIVE
Cocaine: NEGATIVE
Fentanyl: NEGATIVE
Methadone Scn, Ur: NEGATIVE
Opiates: NEGATIVE
Tetrahydrocannabinol: POSITIVE — AB

## 2024-10-25 LAB — URINALYSIS, ROUTINE W REFLEX MICROSCOPIC
Bilirubin Urine: NEGATIVE
Glucose, UA: NEGATIVE mg/dL
Hgb urine dipstick: NEGATIVE
Ketones, ur: NEGATIVE mg/dL
Leukocytes,Ua: NEGATIVE
Nitrite: NEGATIVE
Protein, ur: NEGATIVE mg/dL
Specific Gravity, Urine: 1.015 (ref 1.005–1.030)
pH: 8 (ref 5.0–8.0)

## 2024-10-25 LAB — CBC WITH DIFFERENTIAL/PLATELET
Abs Immature Granulocytes: 0.03 K/uL (ref 0.00–0.07)
Basophils Absolute: 0 K/uL (ref 0.0–0.1)
Basophils Relative: 0 %
Eosinophils Absolute: 0.2 K/uL (ref 0.0–0.5)
Eosinophils Relative: 3 %
HCT: 38.1 % (ref 36.0–46.0)
Hemoglobin: 13.2 g/dL (ref 12.0–15.0)
Immature Granulocytes: 0 %
Lymphocytes Relative: 7 %
Lymphs Abs: 0.5 K/uL — ABNORMAL LOW (ref 0.7–4.0)
MCH: 32 pg (ref 26.0–34.0)
MCHC: 34.6 g/dL (ref 30.0–36.0)
MCV: 92.5 fL (ref 80.0–100.0)
Monocytes Absolute: 0.4 K/uL (ref 0.1–1.0)
Monocytes Relative: 6 %
Neutro Abs: 6 K/uL (ref 1.7–7.7)
Neutrophils Relative %: 84 %
Platelets: 128 K/uL — ABNORMAL LOW (ref 150–400)
RBC: 4.12 MIL/uL (ref 3.87–5.11)
RDW: 12.2 % (ref 11.5–15.5)
WBC: 7.1 K/uL (ref 4.0–10.5)
nRBC: 0 % (ref 0.0–0.2)

## 2024-10-25 LAB — RESP PANEL BY RT-PCR (RSV, FLU A&B, COVID)  RVPGX2
Influenza A by PCR: POSITIVE — AB
Influenza B by PCR: NEGATIVE
Resp Syncytial Virus by PCR: NEGATIVE
SARS Coronavirus 2 by RT PCR: NEGATIVE

## 2024-10-25 LAB — COMPREHENSIVE METABOLIC PANEL WITH GFR
ALT: 8 U/L (ref 0–44)
AST: 19 U/L (ref 15–41)
Albumin: 4.4 g/dL (ref 3.5–5.0)
Alkaline Phosphatase: 70 U/L (ref 38–126)
Anion gap: 10 (ref 5–15)
BUN: 10 mg/dL (ref 6–20)
CO2: 24 mmol/L (ref 22–32)
Calcium: 9 mg/dL (ref 8.9–10.3)
Chloride: 106 mmol/L (ref 98–111)
Creatinine, Ser: 0.75 mg/dL (ref 0.44–1.00)
GFR, Estimated: >60 mL/min
Glucose, Bld: 83 mg/dL (ref 70–99)
Potassium: 3.9 mmol/L (ref 3.5–5.1)
Sodium: 140 mmol/L (ref 135–145)
Total Bilirubin: 0.4 mg/dL (ref 0.0–1.2)
Total Protein: 6.9 g/dL (ref 6.5–8.1)

## 2024-10-25 LAB — MAGNESIUM: Magnesium: 1.6 mg/dL — ABNORMAL LOW (ref 1.7–2.4)

## 2024-10-25 LAB — TROPONIN T, HIGH SENSITIVITY: Troponin T High Sensitivity: <15 ng/L (ref 0–19)

## 2024-10-25 LAB — CK: Total CK: 113 U/L (ref 38–234)

## 2024-10-25 MED ORDER — DROPERIDOL 2.5 MG/ML IJ SOLN
1.2500 mg | Freq: Once | INTRAMUSCULAR | Status: AC
  Administered 2024-10-25: 1.25 mg via INTRAVENOUS
  Filled 2024-10-25: qty 2

## 2024-10-25 MED ORDER — LACTATED RINGERS IV BOLUS
1000.0000 mL | Freq: Once | INTRAVENOUS | Status: AC
  Administered 2024-10-25: 1000 mL via INTRAVENOUS

## 2024-10-25 MED ORDER — IBUPROFEN 400 MG PO TABS
400.0000 mg | ORAL_TABLET | Freq: Once | ORAL | Status: AC | PRN
  Administered 2024-10-25: 400 mg via ORAL
  Filled 2024-10-25: qty 1

## 2024-10-25 MED ORDER — KETOROLAC TROMETHAMINE 15 MG/ML IJ SOLN
15.0000 mg | Freq: Once | INTRAMUSCULAR | Status: AC
  Administered 2024-10-25: 15 mg via INTRAVENOUS
  Filled 2024-10-25: qty 1

## 2024-10-25 MED ORDER — ONDANSETRON HCL 4 MG/2ML IJ SOLN
4.0000 mg | Freq: Once | INTRAMUSCULAR | Status: AC
  Administered 2024-10-25: 4 mg via INTRAVENOUS
  Filled 2024-10-25: qty 2

## 2024-10-25 MED ORDER — ONDANSETRON 4 MG PO TBDP: 4.0000 mg | ORAL_TABLET | Freq: Three times a day (TID) | ORAL | 0 refills | Status: AC | PRN

## 2024-10-25 MED ORDER — ONDANSETRON 4 MG PO TBDP
4.0000 mg | ORAL_TABLET | Freq: Once | ORAL | Status: AC
  Administered 2024-10-25: 4 mg via ORAL
  Filled 2024-10-25: qty 1

## 2024-10-25 MED ORDER — ACETAMINOPHEN 500 MG PO TABS
1000.0000 mg | ORAL_TABLET | ORAL | Status: AC
  Administered 2024-10-25: 1000 mg via ORAL
  Filled 2024-10-25: qty 2

## 2024-10-25 MED ORDER — MAGNESIUM SULFATE IN D5W 1-5 GM/100ML-% IV SOLN
1.0000 g | Freq: Once | INTRAVENOUS | Status: AC
  Administered 2024-10-25: 1 g via INTRAVENOUS
  Filled 2024-10-25: qty 100

## 2024-10-25 NOTE — ED Provider Notes (Signed)
 " [place] [facility] AT [facility] Provider Note   CSN: [id number] Arrival date & time: 10/25/24  9592     Patient presents with: Influenza   Karen Oneill is a 28 y.o. female.  {Add pertinent medical, surgical, social history, OB history to HPI:32947}  Influenza  Patient is a 27 year old female with no pertinent past medical history present emergency room today for complaints of generalized body aches chills nausea vomiting and fatigue that started last night.  She states that she was exposed to influenza by some coworkers.  She denies any chest pain or difficulty breathing.    Prior to Admission medications  Medication Sig Start Date End Date Taking? Authorizing Provider  ondansetron  (ZOFRAN ) 4 MG tablet Take 1 tablet (4 mg total) by mouth every 6 (six) hours. 11/07/22   Blue, Soijett A, PA-C  ondansetron  (ZOFRAN -ODT) 4 MG disintegrating tablet Take 1 tablet (4 mg total) by mouth every 8 (eight) hours as needed for nausea or vomiting. 02/09/22   Geiple, Joshua, PA-C  oseltamivir  (TAMIFLU ) 75 MG capsule Take 1 capsule (75 mg total) by mouth every 12 (twelve) hours. 11/07/22   Blue, Soijett A, PA-C  promethazine  (PHENERGAN ) 25 MG tablet Take 1 tablet (25 mg total) by mouth every 6 (six) hours as needed for nausea or vomiting. 10/01/21   Smoot, Lauraine LABOR, PA-C    Allergies: Sulfa antibiotics    Review of Systems  Updated Vital Signs BP 119/85   Pulse (!) 117   Temp 99.9 F (37.7 C) (Oral)   Resp 15   LMP 10/18/2024   SpO2 100%   Physical Exam Vitals and nursing note reviewed.  Constitutional:      General: She is in acute distress.     Comments: Pleasant uncomfortable appearing 28 year old female retching  HENT:     Head: Normocephalic and atraumatic.     Nose: Nose normal.     Mouth/Throat:     Mouth: Mucous membranes are dry.  Eyes:     General: No scleral icterus. Cardiovascular:     Rate and Rhythm: Regular rhythm. Tachycardia present.      Pulses: Normal pulses.     Heart sounds: Normal heart sounds.  Pulmonary:     Effort: Pulmonary effort is normal. No respiratory distress.     Breath sounds: No wheezing.  Abdominal:     Palpations: Abdomen is soft.     Tenderness: There is no abdominal tenderness. There is no guarding or rebound.  Musculoskeletal:     Cervical back: Normal range of motion.     Right lower leg: No edema.     Left lower leg: No edema.  Skin:    General: Skin is warm and dry.     Capillary Refill: Capillary refill takes less than 2 seconds.  Neurological:     Mental Status: She is alert. Mental status is at baseline.  Psychiatric:        Mood and Affect: Mood normal.        Behavior: Behavior normal.     (all labs ordered are listed, but only abnormal results are displayed) Labs Reviewed  RESP PANEL BY RT-PCR (RSV, FLU A&B, COVID)  RVPGX2 - Abnormal; Notable for the following components:      Result Value   Influenza A by PCR POSITIVE (*)    All other components within normal limits  CBC WITH DIFFERENTIAL/PLATELET - Abnormal; Notable for the following components:   Platelets 128 (*)    Lymphs  Abs 0.5 (*)    All other components within normal limits  URINALYSIS, ROUTINE W REFLEX MICROSCOPIC - Abnormal; Notable for the following components:   APPearance CLOUDY (*)    All other components within normal limits  MAGNESIUM  - Abnormal; Notable for the following components:   Magnesium  1.6 (*)    All other components within normal limits  COMPREHENSIVE METABOLIC PANEL WITH GFR  CK  PREGNANCY, URINE  URINE DRUG SCREEN  TROPONIN T, HIGH SENSITIVITY    EKG: EKG Interpretation Date/Time:  Wednesday October 25 2024 15:17:17 EST Ventricular Rate:  112 PR Interval:  132 QRS Duration:  90 QT Interval:  300 QTC Calculation: 410 R Axis:   86  Text Interpretation: Sinus tachycardia Ventricular premature complex Aberrant complex Right atrial enlargement Borderline repolarization abnormality  Confirmed by Towana Sharper [id number]) on 10/25/2024 3:34:09 PM  Radiology: [facility] Chest Portable 1 View Result Date: 10/25/2024 CLINICAL DATA:  Flu and cough. EXAM: PORTABLE CHEST 1 VIEW COMPARISON:  Chest radiograph dated 02/09/2022. FINDINGS: The heart size and mediastinal contours are within normal limits. Both lungs are clear. The visualized skeletal structures are unremarkable. IMPRESSION: No active disease. Electronically Signed   By: Vanetta Chou M.D.   On: 10/25/2024 15:28    {Document cardiac monitor, telemetry assessment procedure when appropriate:32947} Procedures   Medications Ordered in the ED  magnesium  sulfate IVPB 1 g 100 mL (has no administration in time range)  ibuprofen  (ADVIL ) tablet 400 mg (400 mg Oral Given 10/25/24 0455)  ondansetron  (ZOFRAN -ODT) disintegrating tablet 4 mg (4 mg Oral Given 10/25/24 0455)  lactated ringers  bolus 1,000 mL (0 mLs Intravenous Stopped 10/25/24 1411)  ketorolac  (TORADOL ) 15 MG/ML injection 15 mg (15 mg Intravenous Given 10/25/24 1317)  lactated ringers  bolus 1,000 mL (1,000 mLs Intravenous New Bag/Given 10/25/24 1519)  acetaminophen  (TYLENOL ) tablet 1,000 mg (1,000 mg Oral Given 10/25/24 1520)  ondansetron  (ZOFRAN ) injection 4 mg (4 mg Intravenous Given 10/25/24 1517)      {Click here for ABCD2, HEART and other calculators REFRESH Note before signing:1}                              Medical Decision Making Amount and/or Complexity of Data Reviewed Labs: ordered. Radiology: ordered. ECG/medicine tests: ordered.  Risk OTC drugs. Prescription drug management.   This patient presents to the ED for concern of vomiting, this involves a number of treatment options, and is a complaint that carries with it a high risk of complications and morbidity. A differential diagnosis was considered for the patient's symptoms which is discussed below:   The emergent differential diagnosis for vomiting includes, but is not limited to ACS/MI,  Boerhaave's, DKA, Intracranial Hemorrhage, Ischemic bowel, Meningitis, Sepsis, Acute gastric dilation, Acetaminophen  toxicity, Adrenal insufficiency, Appendicitis, Aspirin toxicity, Bowel obstruction/ileus, Cholecystitis, CNS tumor. Electrolyte abnormalities, Elevated ICP, Gastric outlet obstruction, Hyperemesis gravidarum, Pancreatitis, Peritonitis, Ruptured viscus, Testicular torsion/ovarian torsion, Biliary colic, Cannabinoid hyperemesis syndrome, Disulfiram effect, ETOH, Gastritis, Gastroenteritis, Gastroparesis, Hepatitis, Ibuprofen , Labyrinthitis, Migraine, Motion sickness, Narcotic withdrawal, Thyroid, Pregnancy, Peptic ulcer disease, Renal colic, and UTI   Co morbidities: Discussed in HPI   Brief History:   Patient is a 28 year old female with no pertinent past medical history present emergency room today for complaints of generalized body aches chills nausea vomiting and fatigue that started last night.  She states that she was exposed to influenza by some coworkers.  She denies any chest pain or difficulty breathing.  EMR reviewed including pt PMHx, past surgical history and past visits to ER.   See HPI for more details   Lab Tests:  I ordered and independently interpreted labs. Labs notable for CMP unremarkable CBC without leukocytosis or anemia  Imaging Studies:  NAD. I personally reviewed all imaging studies and no acute abnormality found. I agree with radiology interpretation. Unremarkable chest x-ray   Cardiac Monitoring:  The patient was maintained on a cardiac monitor.  I personally viewed and interpreted the cardiac monitored which showed an underlying rhythm of: Sinus tachycardia EKG non-ischemic   Medicines ordered:  I ordered medication including Tylenol , Zofran , 2 L of LR, Zofran , ibuprofen , Toradol  for pain and fever Reevaluation of the patient after these medicines showed that the patient improved I have reviewed the patients home medicines and  have made adjustments as needed   Critical Interventions:     Consults/Attending Physician   {Blank single:19197::I requested consultation with ***,  and discussed lab and imaging findings as well as pertinent plan - they recommend: ***,I discussed this case with my attending physician who cosigned this note including patient's presenting symptoms, physical exam, and planned diagnostics and interventions. Attending physician stated agreement with plan or made changes to plan which were implemented.}   Reevaluation:  After the interventions noted above I re-evaluated patient and found that they have :{resolved/improved/worsened:23923::improved}   Social Determinants of Health:  {Blank single:19197::Given cab voucher,Social work/case management involved,The patient's social determinants of health were a factor in the care of this patient}    Problem List / ED Course:  ***   Dispostion:  After consideration of the diagnostic results and the patients response to treatment, I feel that the patent would benefit from ***     Final diagnoses:  Influenza    ED Discharge Orders     None        "

## 2024-10-25 NOTE — Discharge Instructions (Addendum)
 It was a pleasure taking care of you today. You were seen in the Emergency Department for evaluation of flulike symptoms. Your work-up was reassuring. Your CT/Xray/Labs showed you are positive for influenza A.  I have given you a prescription for Zofran , which is an antinausea and vomiting medicine.  It has been sent to the [facility] on [facility] as they are the only 24-hour pharmacy in the area.  You may also continue to take Tylenol  and/or ibuprofen  every 6 hours as needed for fever and bodyaches.  It is important to stay hydrated with things like water and/or Gatorade or Pedialyte.  You are considered to be contagious until you are fever free for 24 hours.  A fever is considered a temperature of 100.4 degrees or greater. Refer to the attached documentation for further management of your symptoms. Follow up with your PCP if your symptoms continue.  Please return to the ER if you experience chest pain, trouble breathing, intractable nausea/vomiting or any other life threatening illnesses.

## 2024-10-25 NOTE — ED Triage Notes (Signed)
 Pt arrive POV c/o flu like symptoms, vomiting, chills since last night. Pt states she is been expose to flu by coworkers.

## 2024-10-25 NOTE — ED Provider Notes (Signed)
" °  Physical Exam  BP 104/64   Pulse 91   Temp 100 F (37.8 C) (Oral)   Resp 20   LMP 10/18/2024   SpO2 98%   Physical Exam Vitals and nursing note reviewed.  Constitutional:      Appearance: Normal appearance.  HENT:     Head: Normocephalic and atraumatic.     Mouth/Throat:     Mouth: Mucous membranes are moist.  Eyes:     General: No scleral icterus.       Right eye: No discharge.        Left eye: No discharge.     Conjunctiva/sclera: Conjunctivae normal.  Cardiovascular:     Rate and Rhythm: Regular rhythm. Tachycardia present.     Pulses: Normal pulses.  Pulmonary:     Effort: Pulmonary effort is normal.     Breath sounds: Normal breath sounds.  Abdominal:     General: There is no distension.     Tenderness: There is no abdominal tenderness.  Musculoskeletal:        General: No deformity.     Cervical back: Normal range of motion.  Skin:    General: Skin is warm and dry.     Capillary Refill: Capillary refill takes less than 2 seconds.  Neurological:     Mental Status: She is alert.     Motor: No weakness.  Psychiatric:        Mood and Affect: Mood normal.     Procedures  Procedures  ED Course / MDM    Medical Decision Making Amount and/or Complexity of Data Reviewed Labs: ordered. Radiology: ordered. ECG/medicine tests: ordered.  Risk OTC drugs. Prescription drug management.   Patient received in handoff from M[place], PA-C.  Please see his note for full HPI.  Briefly, patient reports emergency department for evaluation of flulike symptoms and vomiting overnight.  She has received IV fluids, multiple rounds of Zofran  and has continued to vomit.  I then gave her droperidol  as well as IV magnesium .  Patient reports that she is feeling better and has not had any more episodes of emesis.  She is able to tolerate ginger ale p.o.  I sent a prescription of Zofran  to her pharmacy.  I have also educated her on alternating between Tylenol  and/or ibuprofen  as needed  for fever and bodyaches.  Patient verbalized understanding to this.  Her vital signs are stable.  Patient is appropriate for discharge at this time.       Torrence Marry RAMAN, PA-C 10/25/24 1841    Pamella Ozell LABOR, DO 10/29/24 1956  "

## 2024-10-25 NOTE — ED Notes (Signed)
 Awaiting patient from lobby.

## 2024-10-25 NOTE — ED Notes (Signed)
 Pt given ginger ale per for PO challenge.  Awaiting re-eval.
